# Patient Record
Sex: Female | Born: 1955 | Race: Black or African American | Hispanic: No | Smoking: Current every day smoker
Health system: Southern US, Community
[De-identification: ages and names within clinical notes are randomized; demographics above are authoritative.]

## PROBLEM LIST (undated history)

## (undated) DIAGNOSIS — F329 Major depressive disorder, single episode, unspecified: Secondary | ICD-10-CM

## (undated) DIAGNOSIS — F32A Depression, unspecified: Secondary | ICD-10-CM

## (undated) DIAGNOSIS — J45909 Unspecified asthma, uncomplicated: Secondary | ICD-10-CM

## (undated) DIAGNOSIS — E7800 Pure hypercholesterolemia, unspecified: Secondary | ICD-10-CM

## (undated) DIAGNOSIS — I10 Essential (primary) hypertension: Secondary | ICD-10-CM

## (undated) DIAGNOSIS — Z720 Tobacco use: Secondary | ICD-10-CM

## (undated) DIAGNOSIS — E119 Type 2 diabetes mellitus without complications: Secondary | ICD-10-CM

## (undated) DIAGNOSIS — F419 Anxiety disorder, unspecified: Secondary | ICD-10-CM

## (undated) HISTORY — PX: PARTIAL HYSTERECTOMY: SHX80

---

## 2006-04-09 ENCOUNTER — Emergency Department (HOSPITAL_COMMUNITY): Admission: EM | Admit: 2006-04-09 | Discharge: 2006-04-09 | Payer: Self-pay | Admitting: Emergency Medicine

## 2006-05-01 ENCOUNTER — Encounter: Admission: RE | Admit: 2006-05-01 | Discharge: 2006-07-30 | Payer: Self-pay | Admitting: Occupational Medicine

## 2006-11-29 ENCOUNTER — Ambulatory Visit (HOSPITAL_COMMUNITY): Admission: RE | Admit: 2006-11-29 | Discharge: 2006-11-29 | Payer: Self-pay | Admitting: Orthopedic Surgery

## 2010-05-21 ENCOUNTER — Emergency Department (HOSPITAL_BASED_OUTPATIENT_CLINIC_OR_DEPARTMENT_OTHER)
Admission: EM | Admit: 2010-05-21 | Discharge: 2010-05-21 | Disposition: A | Discharge status: Home / Self Care | Payer: BC Managed Care – PPO | Attending: Emergency Medicine | Admitting: Emergency Medicine

## 2010-05-21 ENCOUNTER — Emergency Department (INDEPENDENT_AMBULATORY_CARE_PROVIDER_SITE_OTHER): Payer: BC Managed Care – PPO

## 2010-05-21 DIAGNOSIS — J45909 Unspecified asthma, uncomplicated: Secondary | ICD-10-CM | POA: Insufficient documentation

## 2010-05-21 DIAGNOSIS — R112 Nausea with vomiting, unspecified: Secondary | ICD-10-CM | POA: Insufficient documentation

## 2010-05-21 DIAGNOSIS — E870 Hyperosmolality and hypernatremia: Secondary | ICD-10-CM | POA: Insufficient documentation

## 2010-05-21 LAB — DIFFERENTIAL
Basophils Absolute: 0 10*3/uL (ref 0.0–0.1)
Basophils Relative: 0 % (ref 0–1)
Eosinophils Absolute: 0.1 10*3/uL (ref 0.0–0.7)
Eosinophils Relative: 1 % (ref 0–5)
Lymphocytes Relative: 30 % (ref 12–46)
Lymphs Abs: 2.2 10*3/uL (ref 0.7–4.0)
Monocytes Absolute: 0.3 10*3/uL (ref 0.1–1.0)
Monocytes Relative: 5 % (ref 3–12)
Neutro Abs: 4.7 10*3/uL (ref 1.7–7.7)

## 2010-05-21 LAB — COMPREHENSIVE METABOLIC PANEL
Alkaline Phosphatase: 70 U/L (ref 39–117)
BUN: 11 mg/dL (ref 6–23)
GFR calc non Af Amer: >60 mL/min (ref >60)

## 2010-05-21 LAB — CBC
HCT: 42.4 % (ref 36.0–46.0)
MCV: 93.6 fL (ref 78.0–100.0)
WBC: 7.3 10*3/uL (ref 4.0–10.5)

## 2010-08-01 NOTE — Op Note (Signed)
NAMEAMYRE, [name]              ACCOUNT NO.:  [id number]   MEDICAL RECORD NO.:  [id number]          PATIENT TYPE:  AMB   LOCATION:  SDS                          FACILITY:  [facility]   PHYSICIAN:  Feliberto Gottron. Turner Daniels, M.D.   DATE OF BIRTH:  [birth year]   DATE OF PROCEDURE:  11/29/2006  DATE OF DISCHARGE:                               OPERATIVE REPORT   PREOPERATIVE DIAGNOSIS:  Right knee medial meniscal tear, possible  chondromalacia as well as possible anteromedial bursitis.   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   PROCEDURE:  Right knee arthroscopic partial medial meniscectomy and  debridement of chondromalacia medial femoral condyle medial tibial  plateau and attempted aspiration of anteromedial bursitis.   SURGEON:  Feliberto Gottron. Turner Daniels, M.D.   ASSISTANT:  Skip Mayer, P.A.-C.   ANESTHESIA:  General endotracheal.   ESTIMATED BLOOD LOSS:  Minimal.   FLUID REPLACEMENT:  800 mL crystalloid.   DRAINS PLACED:  None.   TOURNIQUET TIME:  None.   INDICATIONS FOR PROCEDURE:  A 55 year old woman who was injured at work  when a truck tire ran over her right knee and thigh.  When I first met  her, she had some anteromedial bursitis documented by MRI scan that  apparently has defervesced over the last few weeks.  She did have a  medial meniscal tear with mechanical symptoms and desires elective  arthroscopic evaluation and treatment of her right knee.  Risks and  benefits of surgery were discussed, questions answered.   DESCRIPTION OF PROCEDURE:  The patient was identified by arm band and  taken to the operating room at [facility] where the surgery was  done because of body habitus as well as a potential for sleep apnea.  The appropriate anesthetic monitors were attached and general  endotracheal anesthesia induced with the patient in the supine position.  The lateral post applied to the table and then the right lower extremity  was prepped and draped in the usual sterile fashion from the ankle to  the mid thigh.  Using a #11 blade, standard inferomedial and  inferolateral peripatellar portals were then made allowing introduction  of the arthroscope to the inferolateral portal.  The outflow was through  the inferomedial portal.  Diagnostic arthroscopy revealed grade 2 to 3  chondromalacia of the patella and trochlea which is lightly debrided  with a 3.5 gator sucker shaver.  It was more grade 2 chondromalacia not  grade 3.  Moving in the medial compartment, grade 3 chondromalacia with  flap tears of the medial femoral condyle and medial tibial plateau were  identified and debrided as was complex degenerative tearing of the  posterior and medial horns of the medial meniscus.  The ACL and PCL were  intact.  On the lateral side, there was some incidental tearing of the  lateral meniscus that was mainly degenerative and the articular  cartilage in the posterior horn of the  lateral meniscus were in  excellent condition.  The gutters were cleared medially and laterally.  We then attempted to locate or isolate an anteromedial bursa seen on the  MRI scan but not palpated at surgery today.  I made 5 or 6 passes with  an 18-gauge needle in the area where the bursa was supposed to be and  got out a maximum of about 1 mL of normal appearing fluid that has a  slight yellowish tint to it and was translucent.   At this point, the arthroscopic instruments removed.  A dressing of  Xeroform, 4 x 4 dressing, sponges, Webril and an Ace wrap applied.  The  patient was then awakened and taken to the recovery room without  difficulty.      Feliberto Gottron. Turner Daniels, M.D.  Electronically Signed     FJR/MEDQ  D:  11/29/2006  T:  11/29/2006  Job:  [id number]

## 2010-12-29 LAB — DIFFERENTIAL
Basophils Relative: 0
Eosinophils Absolute: 0.2
Lymphocytes Relative: 26
Monocytes Absolute: 0.5
Monocytes Relative: 7
Neutro Abs: 4.9

## 2010-12-29 LAB — PROTIME-INR
INR: 1
Prothrombin Time: 12.9

## 2010-12-29 LAB — CBC
HCT: 44
MCV: 94.1
RBC: 4.67
RDW: 14.6 — ABNORMAL HIGH
WBC: 7.6

## 2010-12-29 LAB — APTT: aPTT: 30

## 2017-09-06 ENCOUNTER — Encounter (HOSPITAL_BASED_OUTPATIENT_CLINIC_OR_DEPARTMENT_OTHER): Payer: Self-pay | Admitting: *Deleted

## 2017-09-06 ENCOUNTER — Emergency Department (HOSPITAL_BASED_OUTPATIENT_CLINIC_OR_DEPARTMENT_OTHER): Payer: Medicare Other

## 2017-09-06 ENCOUNTER — Inpatient Hospital Stay (HOSPITAL_COMMUNITY): Payer: Medicare Other

## 2017-09-06 ENCOUNTER — Inpatient Hospital Stay (HOSPITAL_BASED_OUTPATIENT_CLINIC_OR_DEPARTMENT_OTHER)
Admission: EM | Admit: 2017-09-06 | Discharge: 2017-09-08 | DRG: 189 | Disposition: A | Discharge status: Home / Self Care | Payer: Medicare Other | Attending: Internal Medicine | Admitting: Internal Medicine

## 2017-09-06 ENCOUNTER — Other Ambulatory Visit: Payer: Self-pay

## 2017-09-06 DIAGNOSIS — I159 Secondary hypertension, unspecified: Secondary | ICD-10-CM | POA: Diagnosis not present

## 2017-09-06 DIAGNOSIS — E119 Type 2 diabetes mellitus without complications: Secondary | ICD-10-CM | POA: Diagnosis present

## 2017-09-06 DIAGNOSIS — I2510 Atherosclerotic heart disease of native coronary artery without angina pectoris: Secondary | ICD-10-CM | POA: Diagnosis present

## 2017-09-06 DIAGNOSIS — Z6841 Body Mass Index (BMI) 40.0 and over, adult: Secondary | ICD-10-CM

## 2017-09-06 DIAGNOSIS — I700 Atherosclerosis of aorta: Secondary | ICD-10-CM | POA: Diagnosis present

## 2017-09-06 DIAGNOSIS — Z7984 Long term (current) use of oral hypoglycemic drugs: Secondary | ICD-10-CM | POA: Diagnosis not present

## 2017-09-06 DIAGNOSIS — R112 Nausea with vomiting, unspecified: Secondary | ICD-10-CM | POA: Diagnosis present

## 2017-09-06 DIAGNOSIS — T380X5A Adverse effect of glucocorticoids and synthetic analogues, initial encounter: Secondary | ICD-10-CM | POA: Diagnosis not present

## 2017-09-06 DIAGNOSIS — J441 Chronic obstructive pulmonary disease with (acute) exacerbation: Secondary | ICD-10-CM

## 2017-09-06 DIAGNOSIS — E8770 Fluid overload, unspecified: Secondary | ICD-10-CM | POA: Diagnosis not present

## 2017-09-06 DIAGNOSIS — F419 Anxiety disorder, unspecified: Secondary | ICD-10-CM | POA: Diagnosis present

## 2017-09-06 DIAGNOSIS — Z79899 Other long term (current) drug therapy: Secondary | ICD-10-CM

## 2017-09-06 DIAGNOSIS — Z5329 Procedure and treatment not carried out because of patient's decision for other reasons: Secondary | ICD-10-CM | POA: Diagnosis not present

## 2017-09-06 DIAGNOSIS — F329 Major depressive disorder, single episode, unspecified: Secondary | ICD-10-CM | POA: Diagnosis not present

## 2017-09-06 DIAGNOSIS — R0989 Other specified symptoms and signs involving the circulatory and respiratory systems: Secondary | ICD-10-CM | POA: Diagnosis not present

## 2017-09-06 DIAGNOSIS — J9601 Acute respiratory failure with hypoxia: Principal | ICD-10-CM | POA: Diagnosis present

## 2017-09-06 DIAGNOSIS — F17210 Nicotine dependence, cigarettes, uncomplicated: Secondary | ICD-10-CM | POA: Diagnosis present

## 2017-09-06 DIAGNOSIS — R0902 Hypoxemia: Secondary | ICD-10-CM

## 2017-09-06 DIAGNOSIS — F32A Depression, unspecified: Secondary | ICD-10-CM

## 2017-09-06 DIAGNOSIS — E785 Hyperlipidemia, unspecified: Secondary | ICD-10-CM | POA: Diagnosis not present

## 2017-09-06 DIAGNOSIS — D72829 Elevated white blood cell count, unspecified: Secondary | ICD-10-CM | POA: Diagnosis not present

## 2017-09-06 DIAGNOSIS — I10 Essential (primary) hypertension: Secondary | ICD-10-CM | POA: Diagnosis present

## 2017-09-06 DIAGNOSIS — J810 Acute pulmonary edema: Secondary | ICD-10-CM | POA: Diagnosis not present

## 2017-09-06 DIAGNOSIS — R0602 Shortness of breath: Secondary | ICD-10-CM

## 2017-09-06 DIAGNOSIS — R220 Localized swelling, mass and lump, head: Secondary | ICD-10-CM | POA: Diagnosis present

## 2017-09-06 DIAGNOSIS — D751 Secondary polycythemia: Secondary | ICD-10-CM | POA: Diagnosis present

## 2017-09-06 DIAGNOSIS — Z23 Encounter for immunization: Secondary | ICD-10-CM | POA: Diagnosis present

## 2017-09-06 DIAGNOSIS — Z720 Tobacco use: Secondary | ICD-10-CM

## 2017-09-06 HISTORY — DX: Anxiety disorder, unspecified: F41.9

## 2017-09-06 HISTORY — DX: Essential (primary) hypertension: I10

## 2017-09-06 HISTORY — DX: Pure hypercholesterolemia, unspecified: E78.00

## 2017-09-06 HISTORY — DX: Unspecified asthma, uncomplicated: J45.909

## 2017-09-06 HISTORY — DX: Major depressive disorder, single episode, unspecified: F32.9

## 2017-09-06 HISTORY — DX: Type 2 diabetes mellitus without complications: E11.9

## 2017-09-06 HISTORY — DX: Tobacco use: Z72.0

## 2017-09-06 HISTORY — DX: Depression, unspecified: F32.A

## 2017-09-06 LAB — CBC WITH DIFFERENTIAL/PLATELET
BASOS PCT: 0 %
Basophils Absolute: 0 10*3/uL (ref 0.0–0.1)
EOS ABS: 0.1 10*3/uL (ref 0.0–0.7)
Eosinophils Relative: 2 %
HCT: 47 % — ABNORMAL HIGH (ref 36.0–46.0)
HEMOGLOBIN: 15.2 g/dL — AB (ref 12.0–15.0)
Lymphocytes Relative: 19 %
Lymphs Abs: 1.5 10*3/uL (ref 0.7–4.0)
MCH: 31.7 pg (ref 26.0–34.0)
MCHC: 32.3 g/dL (ref 30.0–36.0)
MCV: 98.1 fL (ref 78.0–100.0)
MONO ABS: 0.4 10*3/uL (ref 0.1–1.0)
MONOS PCT: 5 %
NEUTROS PCT: 74 %
Neutro Abs: 6.1 10*3/uL (ref 1.7–7.7)
Platelets: 186 10*3/uL (ref 150–400)
RBC: 4.79 MIL/uL (ref 3.87–5.11)
RDW: 16.9 % — AB (ref 11.5–15.5)
WBC: 8.1 10*3/uL (ref 4.0–10.5)

## 2017-09-06 LAB — COMPREHENSIVE METABOLIC PANEL
ALBUMIN: 3.5 g/dL (ref 3.5–5.0)
ALK PHOS: 60 U/L (ref 38–126)
ALT: 14 U/L (ref 14–54)
ANION GAP: 6 (ref 5–15)
AST: 19 U/L (ref 15–41)
BILIRUBIN TOTAL: 0.4 mg/dL (ref 0.3–1.2)
BUN: 11 mg/dL (ref 6–20)
CO2: 35 mmol/L — ABNORMAL HIGH (ref 22–32)
Calcium: 8.4 mg/dL — ABNORMAL LOW (ref 8.9–10.3)
Chloride: 103 mmol/L (ref 101–111)
Creatinine, Ser: 0.67 mg/dL (ref 0.44–1.00)
GFR calc Af Amer: >60 mL/min (ref >60)
GLUCOSE: 107 mg/dL — AB (ref 65–99)
Potassium: 4.2 mmol/L (ref 3.5–5.1)
Sodium: 144 mmol/L (ref 135–145)
TOTAL PROTEIN: 7.1 g/dL (ref 6.5–8.1)

## 2017-09-06 LAB — BRAIN NATRIURETIC PEPTIDE: B NATRIURETIC PEPTIDE 5: 46 pg/mL (ref 0.0–100.0)

## 2017-09-06 LAB — GLUCOSE, CAPILLARY
Glucose-Capillary: 164 mg/dL — ABNORMAL HIGH (ref 65–99)
Glucose-Capillary: 168 mg/dL — ABNORMAL HIGH (ref 65–99)

## 2017-09-06 LAB — TROPONIN I

## 2017-09-06 MED ORDER — METHYLPREDNISOLONE SODIUM SUCC 125 MG IJ SOLR
60.0000 mg | Freq: Four times a day (QID) | INTRAMUSCULAR | Status: DC
  Administered 2017-09-06 – 2017-09-08 (×7): 60 mg via INTRAVENOUS
  Filled 2017-09-06 (×8): qty 2

## 2017-09-06 MED ORDER — IPRATROPIUM-ALBUTEROL 0.5-2.5 (3) MG/3ML IN SOLN
3.0000 mL | Freq: Four times a day (QID) | RESPIRATORY_TRACT | Status: DC
  Administered 2017-09-06: 3 mL via RESPIRATORY_TRACT
  Filled 2017-09-06: qty 3

## 2017-09-06 MED ORDER — METHYLPREDNISOLONE SODIUM SUCC 125 MG IJ SOLR
125.0000 mg | Freq: Once | INTRAMUSCULAR | Status: AC
  Administered 2017-09-06: 125 mg via INTRAVENOUS
  Filled 2017-09-06: qty 2

## 2017-09-06 MED ORDER — ACETAMINOPHEN 650 MG RE SUPP: 650.0000 mg | Freq: Four times a day (QID) | RECTAL | Status: DC | PRN

## 2017-09-06 MED ORDER — ONDANSETRON HCL 4 MG/2ML IJ SOLN: 4.0000 mg | Freq: Four times a day (QID) | INTRAMUSCULAR | Status: DC | PRN

## 2017-09-06 MED ORDER — ALBUTEROL SULFATE (2.5 MG/3ML) 0.083% IN NEBU
2.5000 mg | INHALATION_SOLUTION | Freq: Once | RESPIRATORY_TRACT | Status: AC
  Administered 2017-09-06: 2.5 mg via RESPIRATORY_TRACT
  Filled 2017-09-06: qty 3

## 2017-09-06 MED ORDER — ALBUTEROL SULFATE (2.5 MG/3ML) 0.083% IN NEBU
2.5000 mg | INHALATION_SOLUTION | RESPIRATORY_TRACT | Status: DC | PRN
  Administered 2017-09-08: 2.5 mg via RESPIRATORY_TRACT
  Filled 2017-09-06: qty 3

## 2017-09-06 MED ORDER — NEBIVOLOL HCL 5 MG PO TABS
5.0000 mg | ORAL_TABLET | Freq: Every day | ORAL | Status: DC
  Administered 2017-09-06 – 2017-09-07 (×2): 5 mg via ORAL
  Filled 2017-09-06 (×3): qty 1

## 2017-09-06 MED ORDER — HYDROCODONE-ACETAMINOPHEN 5-325 MG PO TABS: 1.0000 | ORAL_TABLET | ORAL | Status: DC | PRN

## 2017-09-06 MED ORDER — SODIUM CHLORIDE 0.9% FLUSH
3.0000 mL | Freq: Two times a day (BID) | INTRAVENOUS | Status: DC
  Administered 2017-09-06 – 2017-09-07 (×3): 3 mL via INTRAVENOUS

## 2017-09-06 MED ORDER — SODIUM CHLORIDE 0.9% FLUSH: 3.0000 mL | INTRAVENOUS | Status: DC | PRN

## 2017-09-06 MED ORDER — POLYETHYLENE GLYCOL 3350 17 G PO PACK: 17.0000 g | PACK | Freq: Every day | ORAL | Status: DC | PRN

## 2017-09-06 MED ORDER — IPRATROPIUM-ALBUTEROL 0.5-2.5 (3) MG/3ML IN SOLN
3.0000 mL | Freq: Two times a day (BID) | RESPIRATORY_TRACT | Status: DC
  Administered 2017-09-07 – 2017-09-08 (×3): 3 mL via RESPIRATORY_TRACT
  Filled 2017-09-06 (×2): qty 3

## 2017-09-06 MED ORDER — IRBESARTAN 150 MG PO TABS
300.0000 mg | ORAL_TABLET | Freq: Every day | ORAL | Status: DC
  Administered 2017-09-06 – 2017-09-08 (×3): 300 mg via ORAL
  Filled 2017-09-06 (×3): qty 2

## 2017-09-06 MED ORDER — AMLODIPINE BESYLATE 10 MG PO TABS
10.0000 mg | ORAL_TABLET | Freq: Every day | ORAL | Status: DC
  Administered 2017-09-06 – 2017-09-08 (×3): 10 mg via ORAL
  Filled 2017-09-06 (×3): qty 1

## 2017-09-06 MED ORDER — ALBUTEROL (5 MG/ML) CONTINUOUS INHALATION SOLN
15.0000 mg/h | INHALATION_SOLUTION | Freq: Once | RESPIRATORY_TRACT | Status: AC
  Administered 2017-09-06: 15 mg/h via RESPIRATORY_TRACT
  Filled 2017-09-06: qty 20

## 2017-09-06 MED ORDER — SERTRALINE HCL 50 MG PO TABS
50.0000 mg | ORAL_TABLET | Freq: Every day | ORAL | Status: DC
  Administered 2017-09-06 – 2017-09-08 (×3): 50 mg via ORAL
  Filled 2017-09-06 (×3): qty 1

## 2017-09-06 MED ORDER — ONDANSETRON HCL 4 MG PO TABS: 4.0000 mg | ORAL_TABLET | Freq: Four times a day (QID) | ORAL | Status: DC | PRN

## 2017-09-06 MED ORDER — ACETAMINOPHEN 325 MG PO TABS
650.0000 mg | ORAL_TABLET | Freq: Four times a day (QID) | ORAL | Status: DC | PRN
  Administered 2017-09-07: 650 mg via ORAL
  Filled 2017-09-06: qty 2

## 2017-09-06 MED ORDER — PNEUMOCOCCAL VAC POLYVALENT 25 MCG/0.5ML IJ INJ
0.5000 mL | INJECTION | INTRAMUSCULAR | Status: AC
  Administered 2017-09-07: 0.5 mL via INTRAMUSCULAR
  Filled 2017-09-06: qty 0.5

## 2017-09-06 MED ORDER — SODIUM CHLORIDE 0.9 % IV SOLN: 250.0000 mL | INTRAVENOUS | Status: DC | PRN

## 2017-09-06 MED ORDER — ENOXAPARIN SODIUM 60 MG/0.6ML ~~LOC~~ SOLN
60.0000 mg | SUBCUTANEOUS | Status: DC
  Administered 2017-09-06 – 2017-09-07 (×2): 60 mg via SUBCUTANEOUS
  Filled 2017-09-06 (×2): qty 0.6

## 2017-09-06 MED ORDER — SIMVASTATIN 10 MG PO TABS
20.0000 mg | ORAL_TABLET | Freq: Every day | ORAL | Status: DC
  Administered 2017-09-06 – 2017-09-08 (×3): 20 mg via ORAL
  Filled 2017-09-06 (×3): qty 2

## 2017-09-06 MED ORDER — FUROSEMIDE 10 MG/ML IJ SOLN
40.0000 mg | Freq: Once | INTRAMUSCULAR | Status: AC
  Administered 2017-09-06: 40 mg via INTRAVENOUS
  Filled 2017-09-06: qty 4

## 2017-09-06 MED ORDER — VALSARTAN-HYDROCHLOROTHIAZIDE 320-25 MG PO TABS: 1.0000 | ORAL_TABLET | Freq: Every day | ORAL | Status: DC

## 2017-09-06 MED ORDER — INSULIN ASPART 100 UNIT/ML ~~LOC~~ SOLN
0.0000 [IU] | Freq: Three times a day (TID) | SUBCUTANEOUS | Status: DC
  Administered 2017-09-06: 3 [IU] via SUBCUTANEOUS
  Administered 2017-09-07: 2 [IU] via SUBCUTANEOUS
  Administered 2017-09-07: 3 [IU] via SUBCUTANEOUS
  Administered 2017-09-07 – 2017-09-08 (×2): 2 [IU] via SUBCUTANEOUS
  Administered 2017-09-08: 3 [IU] via SUBCUTANEOUS
  Administered 2017-09-08: 2 [IU] via SUBCUTANEOUS

## 2017-09-06 MED ORDER — IPRATROPIUM-ALBUTEROL 0.5-2.5 (3) MG/3ML IN SOLN
3.0000 mL | Freq: Once | RESPIRATORY_TRACT | Status: AC
  Administered 2017-09-06: 3 mL via RESPIRATORY_TRACT
  Filled 2017-09-06: qty 3

## 2017-09-06 MED ORDER — HYDROCHLOROTHIAZIDE 25 MG PO TABS
25.0000 mg | ORAL_TABLET | Freq: Every day | ORAL | Status: DC
  Administered 2017-09-06 – 2017-09-07 (×2): 25 mg via ORAL
  Filled 2017-09-06 (×2): qty 1

## 2017-09-06 MED ORDER — DOXYCYCLINE HYCLATE 100 MG PO TABS
100.0000 mg | ORAL_TABLET | Freq: Two times a day (BID) | ORAL | Status: DC
  Administered 2017-09-06 – 2017-09-08 (×5): 100 mg via ORAL
  Filled 2017-09-06 (×5): qty 1

## 2017-09-06 NOTE — Progress Notes (Signed)
  Echocardiogram 2D Echocardiogram has been performed.  Kenisha Lynds T Calley Drenning 09/06/2017, 4:30 PM

## 2017-09-06 NOTE — Progress Notes (Signed)
   [facility]. Requiring [employer] O2 to maintain sat > 92%. CXR with pulmonary edema and on exam trace peripheral edema as well as respiratory wheezes. She has received IV lasix, solumedrol, continuous nebs and improved clinically. [facility]    Noralee StainJennifer Jamareon Shimel, DO [facility] [email or web address] Password [email or web address] 09/06/2017, 9:57 AM

## 2017-09-06 NOTE — ED Provider Notes (Signed)
Emergency Department Provider Note   I have reviewed the triage vital signs and the nursing notes.   HISTORY  Chief Complaint Shortness of Breath   HPI [name] is a 62 y.o. female with PMH of COPD, asthma, elevated BMI, DM, and HTN presents to the emergency department for evaluation of shortness of breath and sore throat.  Symptoms began yesterday and worsened through the evening.  She denies any fevers or chills.  She feels like her tongue may be slightly swollen sore throat.  She has not started any new medications.  She denies any itchy rash.  No fevers or chills.  No productive cough or hemoptysis.  No vomiting or diarrhea.  Allergies Patient has no known allergies.  No family history on file.  Social History Not currently a smoker.   Review of Systems  Constitutional: No fever/chills Eyes: No visual changes. ENT: Positive sore throat and tongue swelling.  Cardiovascular: Denies chest pain. Respiratory: Positive shortness of breath. Gastrointestinal: No abdominal pain.  No nausea, no vomiting.  No diarrhea.  No constipation. Genitourinary: Negative for dysuria. Musculoskeletal: Negative for back pain. Skin: Negative for rash. Neurological: Negative for headaches, focal weakness or numbness.  10-point ROS otherwise negative.  ____________________________________________   PHYSICAL EXAM:  VITAL SIGNS: ED Triage Vitals  Enc Vitals Group     BP 09/06/17 0821 (!) 180/102     Pulse Rate 09/06/17 0821 65     Resp 09/06/17 0821 (!) 30     Temp 09/06/17 0821 98.6 F (37 C)     Temp Source 09/06/17 0821 Oral     SpO2 09/06/17 0821 (!) 76 %     Weight 09/06/17 0828 253 lb (114.8 kg)     Height 09/06/17 0828 5\' 1"  (1.549 m)   Constitutional: Alert and oriented. Well appearing and in no acute distress. Eyes: Conjunctivae are normal. Head: Atraumatic. Nose: No congestion/rhinnorhea. Mouth/Throat: Mucous membranes are moist.  Oropharynx non-erythematous. No  appreciable tongue swelling. Posterior pharynx is easily visualized. No PTA. Managing oral secretions. Normal voice.  Neck: No stridor. Tender cervical adenopathy.  Cardiovascular: Normal rate, regular rhythm. Good peripheral circulation. Grossly normal heart sounds.   Respiratory: Increased respiratory effort.  No retractions. Lungs diminished bilaterally with faint end-expiratory wheezing at the bases. No rales or rhonchi. Overall poor air movement.  Gastrointestinal: Soft and nontender. No distention.  Musculoskeletal: No lower extremity tenderness nor edema. No gross deformities of extremities. Neurologic:  Normal speech and language. No gross focal neurologic deficits are appreciated.  Skin:  Skin is warm, dry and intact. No rash noted.  ____________________________________________   LABS (all labs ordered are listed, but only abnormal results are displayed)  Labs Reviewed  COMPREHENSIVE METABOLIC PANEL - Abnormal; Notable for the following components:      Result Value   CO2 35 (*)    Glucose, Bld 107 (*)    Calcium 8.4 (*)    All other components within normal limits  CBC WITH DIFFERENTIAL/PLATELET - Abnormal; Notable for the following components:   Hemoglobin 15.2 (*)    HCT 47.0 (*)    RDW 16.9 (*)    All other components within normal limits  TROPONIN I  BRAIN NATRIURETIC PEPTIDE   ____________________________________________  EKG   EKG Interpretation  Date/Time:  Friday September 06 2017 08:30:41 EDT Ventricular Rate:  60 PR Interval:    QRS Duration: 100 QT Interval:  416 QTC Calculation: 416 R Axis:   93 Text Interpretation:  Sinus rhythm Right  axis deviation Low voltage, extremity and precordial leads No STEMI.  Confirmed by Alona Bene [phone]) on 09/06/2017 8:39:35 AM       ____________________________________________  RADIOLOGY  Dg Chest Portable 1 View  Result Date: 09/06/2017 CLINICAL DATA:  Shortness of breath. EXAM: PORTABLE CHEST 1 VIEW COMPARISON:   05/21/2010. FINDINGS: Cardiomegaly with pulmonary vascular prominence and bilateral interstitial prominence consistent with CHF. No pleural effusion or pneumothorax. IMPRESSION: Cardiomegaly with pulmonary vascular prominence and bilateral interstitial prominence suggesting CHF. Electronically Signed   By: Maisie Fus  Register   On: 09/06/2017 08:52    ____________________________________________   PROCEDURES  Procedure(s) performed:   .Critical Care Performed by: Maia Plan, MD Authorized by: Maia Plan, MD   Critical care provider statement:    Critical care time (minutes):  35   Critical care time was exclusive of:  Separately billable procedures and treating other patients and teaching time   Critical care was necessary to treat or prevent imminent or life-threatening deterioration of the following conditions:  Respiratory failure   Critical care was time spent personally by me on the following activities:  Development of treatment plan with patient or surrogate, blood draw for specimens, evaluation of patient's response to treatment, examination of patient, obtaining history from patient or surrogate, ordering and performing treatments and interventions, ordering and review of laboratory studies, ordering and review of radiographic studies, pulse oximetry, re-evaluation of patient's condition and review of old charts   I assumed direction of critical care for this patient from another provider in my specialty: no       ____________________________________________   INITIAL IMPRESSION / ASSESSMENT AND PLAN / ED COURSE  Pertinent labs & imaging results that were available during my care of the patient were reviewed by me and considered in my medical decision making (see chart for details).  Patient presents to the emergency department for evaluation after bruising worsening shortness of breath and sensation of sore throat with tongue swelling.  I do not appreciate any tongue or  posterior pharyngeal swelling on my exam.  Patient is speaking in a clear voice.  He has a history of COPD and her lung exam correlates well with COPD exacerbation.  She has decreased air entry with end expiratory wheezing at the bases bilaterally.  Patient is not on ACE or ARB.  Plan for aggressive treatment of her shortness of breath with albuterol.  Patient will likely need transition to continuous albuterol.  She is hypoxemic on arrival but overall is doing well.  She is able to provide a history and is joking with staff at times.   09:00 AM Patient feeling better now on continuous. CXR with no infiltrate. Some pulmonary edema noted. No known history of CHF. Fluid may be contributing to SOB and hypoxemia so will start lasix but suspect that presentation is multifactorial. Steroids ordered as well. Anticipate admission. Patient currently on 4L O2.   Discussed patient's case with Hospitalist to request admission. Patient and family (if present) updated with plan. Care transferred to Hospitalist service.  I reviewed all nursing notes, vitals, pertinent old records, EKGs, labs, imaging (as available).  ____________________________________________  FINAL CLINICAL IMPRESSION(S) / ED DIAGNOSES  Final diagnoses:  COPD exacerbation (HCC)  Hypoxia  Acute pulmonary edema (HCC)     MEDICATIONS GIVEN DURING THIS VISIT:  Medications  ipratropium-albuterol (DUONEB) 0.5-2.5 (3) MG/3ML nebulizer solution 3 mL (3 mLs Nebulization Given 09/06/17 0836)  albuterol (PROVENTIL) (2.5 MG/3ML) 0.083% nebulizer solution 2.5 mg (2.5 mg Nebulization Given  09/06/17 0836)  albuterol (PROVENTIL,VENTOLIN) solution continuous neb (15 mg/hr Nebulization Given 09/06/17 0849)  methylPREDNISolone sodium succinate (SOLU-MEDROL) 125 mg/2 mL injection 125 mg (125 mg Intravenous Given 09/06/17 1033)  furosemide (LASIX) injection 40 mg (40 mg Intravenous Given 09/06/17 1033)    Note:  This document was prepared using Dragon voice  recognition software and may include unintentional dictation errors.  Alona Bene, MD Emergency Medicine    Jaxx Huish, Arlyss Repress, MD 09/06/17 [phone]

## 2017-09-06 NOTE — H&P (Signed)
History and Physical    [name] YNW:[id number] DOB: 09/07/1955 DOA: 09/06/2017  [facility]  [facility] from: Home [facility]   Chief Complaint: SOB  HPI: Marie FareRoslyn Henderson is a 62 y.o. female with medical history significant of DM, HTN, HLD, depression who presents with 1 day history of shortness of breath, non-productive cough. She also admits to swollen tongue but now that has resolved. She denies any fevers, but has had chills. Some nausea and 1 time vomiting, but no abdominal pain or diarrhea.  She denies any chest pain. She is a current everyday smoker, has cut herself down from 2 packs/day to 6 cigarettes/day.  She has been smoking for over 20 years.  She also admits to peripheral edema over the past week.  ED Course: Patient was hypoxic on room air 76 to 83%.  This improved with nasal cannula O2.  Troponin negative, BNP 46.  Chest x-ray showed "cardiomegaly with pulmonary vascular prominence and bilateral interstitial prominence suggesting CHF"  Review of Systems: As per HPI otherwise 10 point review of systems negative.   Past Medical History:  Diagnosis Date  . Anxiety   . Asthma   . Depression   . Diabetes mellitus without complication (HCC)   . Hypercholesteremia   . Hypertension   . Tobacco abuse     Past Surgical History:  Procedure Laterality Date  . CESAREAN SECTION     x2  . PARTIAL HYSTERECTOMY       reports that she has been smoking cigarettes.  She does not have any smokeless tobacco history on file. Her alcohol and drug histories are not on file.  No Known Allergies  Family History  Problem Relation Age of Onset  . Alzheimer's disease Mother   . Diabetes Brother     Prior to Admission medications   Medication Sig Start Date End Date Taking? Authorizing Provider  albuterol (VENTOLIN HFA) 108 (90 Base) MCG/ACT inhaler Inhale into the lungs. 04/20/16  Yes [provider]  metFORMIN (GLUCOPHAGE) 500 MG tablet Take by mouth.  06/29/16  Yes [provider]  nebivolol (BYSTOLIC) 5 MG tablet Take by mouth. 08/21/16  Yes [provider]  albuterol (PROVENTIL) (2.5 MG/3ML) 0.083% nebulizer solution Inhale into the lungs.    [provider]  amLODipine (NORVASC) 10 MG tablet Take by mouth.    [provider]  Cholecalciferol (VITAMIN D-1000 MAX ST) 1000 units tablet Take by mouth.    [provider]  furosemide (LASIX) 20 MG tablet  08/15/17   [provider]  oxybutynin (DITROPAN) 5 MG tablet Take by mouth.    [provider]  sertraline (ZOLOFT) 50 MG tablet Take by mouth.    [provider]  simvastatin (ZOCOR) 20 MG tablet Take by mouth.    [provider]  TRULICITY 1.5 MG/0.5ML SOPN  06/26/17   [provider]  valsartan-hydrochlorothiazide (DIOVAN-HCT) 320-25 MG tablet Take by mouth.    [provider]  Vitamin D, Ergocalciferol, (DRISDOL) 50000 units CAPS capsule Take by mouth.    [provider]    Physical Exam: Vitals:   09/06/17 0952 09/06/17 1030 09/06/17 1100 09/06/17 1130  BP:  117/70 135/77 123/71  Pulse:  62 63 71  Resp:  (!) 24 (!) 25 (!) 26  Temp:      TempSrc:      SpO2: 94% 90% 93% 92%  Weight:      Height:  Constitutional: NAD, calm, comfortable Eyes: PERRL, lids and conjunctivae normal ENMT: Mucous membranes are moist. Posterior pharynx clear of any exudate or lesions.Normal dentition.  Neck: normal, supple, no masses, no thyromegaly Respiratory: Bilateral wheezes, bibasilar crackles.  On nasal cannula O2, no conversational dyspnea. Normal respiratory effort. No accessory muscle use.  Cardiovascular: Regular rate and rhythm, no murmurs / rubs / gallops.  Trace extremity edema.  Abdomen: no tenderness, no masses palpated. No hepatosplenomegaly. Bowel sounds positive.  Musculoskeletal: no clubbing / cyanosis. No joint deformity upper and lower extremities. Good ROM, no  contractures. Normal muscle tone.  Skin: no rashes, lesions, ulcers. No induration Neurologic: CN 2-12 grossly intact. Strength 5/5 in all 4.  Psychiatric: Normal judgment and insight. Alert and oriented x 3. Normal mood.   Labs on Admission: I have personally reviewed following labs and imaging studies  CBC: Recent Labs  Lab 09/06/17 0838  WBC 8.1  NEUTROABS 6.1  HGB 15.2*  HCT 47.0*  MCV 98.1  PLT 186   Basic Metabolic Panel: Recent Labs  Lab 09/06/17 0838  NA 144  K 4.2  CL 103  CO2 35*  GLUCOSE 107*  BUN 11  CREATININE 0.67  CALCIUM 8.4*   GFR: Estimated Creatinine Clearance: 87 mL/min (by C-G formula based on SCr of 0.67 mg/dL). Liver Function Tests: Recent Labs  Lab 09/06/17 0838  AST 19  ALT 14  ALKPHOS 60  BILITOT 0.4  PROT 7.1  ALBUMIN 3.5   No results for input(s): LIPASE, AMYLASE in the last 168 hours. No results for input(s): AMMONIA in the last 168 hours. Coagulation Profile: No results for input(s): INR, PROTIME in the last 168 hours. Cardiac Enzymes: Recent Labs  Lab 09/06/17 0838  TROPONINI <0.03   BNP (last 3 results) No results for input(s): PROBNP in the last 8760 hours. HbA1C: No results for input(s): HGBA1C in the last 72 hours. CBG: No results for input(s): GLUCAP in the last 168 hours. Lipid Profile: No results for input(s): CHOL, HDL, LDLCALC, TRIG, CHOLHDL, LDLDIRECT in the last 72 hours. Thyroid Function Tests: No results for input(s): TSH, T4TOTAL, FREET4, T3FREE, THYROIDAB in the last 72 hours. Anemia Panel: No results for input(s): VITAMINB12, FOLATE, FERRITIN, TIBC, IRON, RETICCTPCT in the last 72 hours. Urine analysis: No results found for: COLORURINE, APPEARANCEUR, LABSPEC, PHURINE, GLUCOSEU, HGBUR, BILIRUBINUR, KETONESUR, PROTEINUR, UROBILINOGEN, NITRITE, LEUKOCYTESUR Sepsis Labs: !!!!!!!!!!!!!!!!!!!!!!!!!!!!!!!!!!!!!!!!!!!! @LABRCNTIP (procalcitonin:4,lacticidven:4) )No results found for this or any previous visit  (from the past 240 hour(s)).   Radiological Exams on Admission: Dg Chest Portable 1 View  Result Date: 09/06/2017 CLINICAL DATA:  Shortness of breath. EXAM: PORTABLE CHEST 1 VIEW COMPARISON:  05/21/2010. FINDINGS: Cardiomegaly with pulmonary vascular prominence and bilateral interstitial prominence consistent with CHF. No pleural effusion or pneumothorax. IMPRESSION: Cardiomegaly with pulmonary vascular prominence and bilateral interstitial prominence suggesting CHF. Electronically Signed   By: Maisie Fus  Register   On: 09/06/2017 08:52    EKG: Independently reviewed. Normal sinus rhythm   Assessment/Plan Principal Problem:   Acute hypoxemic respiratory failure (HCC) Active Problems:   COPD with acute exacerbation (HCC)   DM (diabetes mellitus) (HCC)   HTN (hypertension)   Depression   HLD (hyperlipidemia)   Tobacco abuse   Acute hypoxemic respiratory failure -Continue nasal cannula O2, wean as able  COPD exacerbation -Doxycycline, IV Solu-Medrol, breathing tx  ?CHF -Chest x-ray revealed bilateral pulmonary vascular prominence with bilateral interstitial prominence.  However BNP only 46.  She does have trace bilateral peripheral edema which is new over the past  week -Patient was given IV Lasix in the emergency department -Check echocardiogram  DM  -Hold metformin -SSI  HTN -Continue bystolic, norvasc, valsartan-HCTZ  Depression -Continue zoloft  HLD -Continue zocor  Tobacco abuse -Cessation counseling   DVT prophylaxis: Lovenox Code Status: Full   Family Communication: Family at bedside Disposition Plan: Need clinical improvement, suspect return to home setting on discharge Consults called: None  Admission status: Inpatient   * I certify that at the point of admission it is my clinical judgment that the patient will require inpatient hospital care spanning beyond 2 midnights from the point of admission due to high intensity of service, high risk for further  deterioration and high frequency of surveillance required.*   Noralee Stain, DO [facility] [email or web address] Password [email or web address] 09/06/2017, 1:06 PM

## 2017-09-06 NOTE — Progress Notes (Signed)
Refused cpap.

## 2017-09-06 NOTE — ED Notes (Signed)
Patient transported to X-ray 

## 2017-09-06 NOTE — ED Triage Notes (Signed)
Pt reports sob and wheezing x this am, brought directly to room 7, rt at bedside, dr. Jacqulyn BathLong alerted, ekg in progress. Initial room air sat 83%, placed on o2 at 4lpm by rt, sats increase to 98%. Pt with labored resp, unable to talk in full sentences, audible wheezes noted. Pt denies any cp, states "My tongue feels swollen." no rash or itching noted, dr. Jacqulyn BathLong at bedside examining pt. During triage.

## 2017-09-06 NOTE — ED Notes (Signed)
Report called [place] the floor [place] RN

## 2017-09-06 NOTE — Progress Notes (Signed)
Pt where a CPAP at [facility]. RT asked the nurse to get the doctor to order.

## 2017-09-06 NOTE — Progress Notes (Signed)
Patient refusing CPAP. States that she does not wear at home every night. RT will continue to monitor.

## 2017-09-07 ENCOUNTER — Encounter (HOSPITAL_COMMUNITY): Payer: Self-pay

## 2017-09-07 DIAGNOSIS — R0989 Other specified symptoms and signs involving the circulatory and respiratory systems: Secondary | ICD-10-CM

## 2017-09-07 DIAGNOSIS — I159 Secondary hypertension, unspecified: Secondary | ICD-10-CM

## 2017-09-07 DIAGNOSIS — F329 Major depressive disorder, single episode, unspecified: Secondary | ICD-10-CM

## 2017-09-07 DIAGNOSIS — J9601 Acute respiratory failure with hypoxia: Principal | ICD-10-CM

## 2017-09-07 DIAGNOSIS — E119 Type 2 diabetes mellitus without complications: Secondary | ICD-10-CM

## 2017-09-07 DIAGNOSIS — E785 Hyperlipidemia, unspecified: Secondary | ICD-10-CM

## 2017-09-07 DIAGNOSIS — J441 Chronic obstructive pulmonary disease with (acute) exacerbation: Secondary | ICD-10-CM

## 2017-09-07 DIAGNOSIS — Z720 Tobacco use: Secondary | ICD-10-CM

## 2017-09-07 LAB — BASIC METABOLIC PANEL
Anion gap: 8 (ref 5–15)
BUN: 15 mg/dL (ref 6–20)
CHLORIDE: 102 mmol/L (ref 101–111)
CO2: 35 mmol/L — ABNORMAL HIGH (ref 22–32)
CREATININE: 0.69 mg/dL (ref 0.44–1.00)
Calcium: 9.2 mg/dL (ref 8.9–10.3)
GFR calc non Af Amer: >60 mL/min (ref >60)
Glucose, Bld: 144 mg/dL — ABNORMAL HIGH (ref 65–99)
POTASSIUM: 4.7 mmol/L (ref 3.5–5.1)
Sodium: 145 mmol/L (ref 135–145)

## 2017-09-07 LAB — CBC
HEMATOCRIT: 52.6 % — AB (ref 36.0–46.0)
HEMOGLOBIN: 16 g/dL — AB (ref 12.0–15.0)
MCH: 31.1 pg (ref 26.0–34.0)
MCHC: 30.4 g/dL (ref 30.0–36.0)
MCV: 102.1 fL — ABNORMAL HIGH (ref 78.0–100.0)
Platelets: 219 10*3/uL (ref 150–400)
RBC: 5.15 MIL/uL — ABNORMAL HIGH (ref 3.87–5.11)
RDW: 16.3 % — ABNORMAL HIGH (ref 11.5–15.5)
WBC: 8.9 10*3/uL (ref 4.0–10.5)

## 2017-09-07 LAB — GLUCOSE, CAPILLARY
GLUCOSE-CAPILLARY: 174 mg/dL — AB (ref 65–99)
Glucose-Capillary: 128 mg/dL — ABNORMAL HIGH (ref 65–99)
Glucose-Capillary: 121 mg/dL — ABNORMAL HIGH (ref 65–99)
Glucose-Capillary: 186 mg/dL — ABNORMAL HIGH (ref 65–99)

## 2017-09-07 LAB — HIV ANTIBODY (ROUTINE TESTING W REFLEX): HIV SCREEN 4TH GENERATION: NONREACTIVE

## 2017-09-07 MED ORDER — FUROSEMIDE 10 MG/ML IJ SOLN
40.0000 mg | Freq: Once | INTRAMUSCULAR | Status: AC
  Administered 2017-09-07: 40 mg via INTRAVENOUS
  Filled 2017-09-07: qty 4

## 2017-09-07 MED ORDER — GUAIFENESIN ER 600 MG PO TB12
1200.0000 mg | ORAL_TABLET | Freq: Two times a day (BID) | ORAL | Status: DC
  Administered 2017-09-07 – 2017-09-08 (×3): 1200 mg via ORAL
  Filled 2017-09-07 (×3): qty 2

## 2017-09-07 NOTE — Progress Notes (Signed)
PROGRESS NOTE    [name]  ZOX:[id number] DOB: 10/31/1955 DOA: 09/06/2017 PCP: [facility]   Brief Narrative:  [name] is a 62 y.o. female with medical history significant of DM, HTN, HLD, depression and other comorbids who presents with 1 day history of shortness of breath, non-productive cough. She also admits to swollen tongue but now that has resolved. She denies any fevers, but has had chills. Some nausea and 1 time vomiting, but no abdominal pain or diarrhea.  She denies any chest pain. She is a current everyday smoker, has cut herself down from 2 packs/day to 6 cigarettes/day.  She has been smoking for over 20 years.  She also admits to peripheral edema over the past week. In the ED patient was hypoxic on room air 76 to 83%.  This improved with nasal cannula O2. Troponin negative, BNP 46.  Chest x-ray showed "cardiomegaly with pulmonary vascular prominence and bilateral interstitial prominence suggesting CHF."  She was admitted for acute hypoxic respiratory failure likely secondary to COPD exacerbation suspected CHF exacerbation is currently being worked up and diuresed.  She is on antibiotics, duo nebs, supplemental oxygen and is being diuresed.  Assessment & Plan:   Principal Problem:   Acute hypoxemic respiratory failure (HCC) Active Problems:   COPD with acute exacerbation (HCC)   DM (diabetes mellitus) (HCC)   HTN (hypertension)   Depression   HLD (hyperlipidemia)   Tobacco abuse  Acute Respiratory Failure with Hypoxia in the setting of Acute COPD Exacerbation as well as concomitant CHF Decompensation  -Patient Diffusely Wheezing on Examination -CXR showed Cardiomegaly with pulmonary vascular prominence and bilateral interstitial prominence consistent with CHF. No pleural effusion or pneumothorax. -Desaturates on Room Air -Continue Supplemental oxygen via nasal cannula and wean oxygen as tolerated -Continuous pulse oximetry monitoring and maintain O2  saturation greater than 92% -Will need Ambulatory walk screen prior to discharge -Repeat chest x-ray in a.m. -Refusing CPAP as well   COPD exacerbation/ Hx of Asthma  -Still actively smokes -Continue with Solu-Medrol 60 mg IV every 6 scheduled -Continue with doxycycline 100 mg p.o. every 12 -Continue with home nebs 2.5 mg nebs every 2 PRN for wheezing and shortness of breath and tinea scheduled DuoNebs 3 mL nebulized twice daily -We will add flutter valve, incentive spirometer, and 1200 mg p.o. twice daily Guaifenesin  Suspected CHF (Unknown Type currently) -Chest x-ray revealed bilateral pulmonary vascular prominence with bilateral interstitial prominence.  -BNP only 46.0 but she is Morbidly obese.  She does have trace bilateral peripheral edema which is new over the past week -Patient was given IV Lasix in the emergency department and will given another IV 40 mg of Lasix today -Checking Echocardiogram and it is done and pending read -Strict I's/O's, Daily Weights, and Fluid Restrict to 1500 mL -Continue to Monitor Volume Status Carefully -C/w Irbesartan 300 mg po Daily and Nebivolol 5 mg po Daily for now  -Patient refusing to wear Telemetry and continues to remove  Diabetes Mellitus Type 2 -Currently Holding Metformin -C/w Moderate Novolog SSI AC -Check HbA1c -CBG's Ranging from 128-168  HTN -C/w Amlodipine 10 mg Daily, Irbesartan 300 mg po Daily, Nebivolol 5 mg po Daily , and will hold HCTZ 25 mg po Daily given IV Lasix Administration today    Depression and Anxiety  -Continue Sertraline 20 mg po Daily   HLD -Continue Simvastatin 20 mg po Daily   Tobacco Abuse -Smoking Cessation counseling given  Polycythemia/Erythrocytosis -Hb/Hct was 16.0/52.6 in the setting of Smoking -  Continue to Monitor and Repeat CBC in AM   DVT prophylaxis: Enoxaparin 60 mg q24h Code Status: FULL CODE Family Communication: No family present at bedside  Disposition Plan: Remain Inpatient  for current workup and treatment   Consultants:   None   Procedures:  ECHOCARDIOGRAM - Done and pending read  Antimicrobials:  Anti-infectives (From admission, onward)   Start     Dose/Rate Route Frequency Ordered Stop   09/06/17 1400  doxycycline (VIBRA-TABS) tablet 100 mg     100 mg Oral Every 12 hours 09/06/17 1313       Subjective: Examined at bedside and woke up from sleep and states that she is no longer having shortness of breath however per nurse this morning when they removed her oxygen she desaturated.  No chest pain, lightheadedness or dizziness.  His leg swelling has been going on for a week and still slightly there.  No other complaints or concerns and does not want to wear her telemetry.  Objective: Vitals:   09/06/17 1321 09/06/17 2005 09/07/17 0458 09/07/17 0811  BP: 131/75 (!) 113/56 127/67   Pulse: 60 64 (!) 57   Resp: (!) 26 (!) 28 18   Temp: 98.9 F (37.2 C) 98.3 F (36.8 C) 98.6 F (37 C)   TempSrc: Oral Oral Oral   SpO2: 99% 93% 91% 93%  Weight:      Height:        Intake/Output Summary (Last 24 hours) at 09/07/2017 0981 Last data filed at 09/07/2017 0825 Gross per 24 hour  Intake 360 ml  Output -  Net 360 ml   Filed Weights   09/06/17 0828  Weight: 114.8 kg (253 lb)   Examination: Physical Exam:  Constitutional: WN/WD morbidly obese AAF in NAD and appears calm and comfortable Eyes: Lids and conjunctivae normal, sclerae anicteric  ENMT: External Ears, Nose appear normal. Grossly normal hearing. Mucous membranes are moist. Neck: Appears normal, supple, no cervical masses, normal ROM, no appreciable thyromegaly, no JVD Respiratory: Diminished to auscultation bilaterally with significant wheezing and mild crackles. Normal respiratory effort and patient is not tachypenic. No accessory muscle use but wearing supplemental O2 via .   Cardiovascular: RRR, no murmurs / rubs / gallops. S1 and S2 auscultated. 1+ LE extremity edema.  Abdomen: Soft,  non-tender, Distended 2/2 body habitus. No masses palpated. No appreciable hepatosplenomegaly. Bowel sounds positive x4.  GU: Deferred. Musculoskeletal: No clubbing / cyanosis of digits/nails. No joint deformity upper and lower extremities.  Skin: No rashes, lesions, ulcers on a limited skin evaluation. No induration; Warm and dry.  Neurologic: CN 2-12 grossly intact with no focal deficits. Sensation intact in all 4 Extremities, DTR normal. Strength 5/5 in all 4. Romberg sign cerebellar reflexes not assessed.  Psychiatric: Normal judgment and insight. Alert and oriented x 3. Normal mood and appropriate affect.   Data Reviewed: I have personally reviewed following labs and imaging studies  CBC: Recent Labs  Lab 09/06/17 0838 09/07/17 0604  WBC 8.1 8.9  NEUTROABS 6.1  --   HGB 15.2* 16.0*  HCT 47.0* 52.6*  MCV 98.1 102.1*  PLT 186 219   Basic Metabolic Panel: Recent Labs  Lab 09/06/17 0838 09/07/17 0604  NA 144 145  K 4.2 4.7  CL 103 102  CO2 35* 35*  GLUCOSE 107* 144*  BUN 11 15  CREATININE 0.67 0.69  CALCIUM 8.4* 9.2   GFR: Estimated Creatinine Clearance: 87 mL/min (by C-G formula based on SCr of 0.69 mg/dL). Liver Function Tests:  Recent Labs  Lab 09/06/17 0838  AST 19  ALT 14  ALKPHOS 60  BILITOT 0.4  PROT 7.1  ALBUMIN 3.5   No results for input(s): LIPASE, AMYLASE in the last 168 hours. No results for input(s): AMMONIA in the last 168 hours. Coagulation Profile: No results for input(s): INR, PROTIME in the last 168 hours. Cardiac Enzymes: Recent Labs  Lab 09/06/17 0838  TROPONINI <0.03   BNP (last 3 results) No results for input(s): PROBNP in the last 8760 hours. HbA1C: No results for input(s): HGBA1C in the last 72 hours. CBG: Recent Labs  Lab 09/06/17 1652 09/06/17 2217 09/07/17 0758  GLUCAP 164* 168* 128*   Lipid Profile: No results for input(s): CHOL, HDL, LDLCALC, TRIG, CHOLHDL, LDLDIRECT in the last 72 hours. Thyroid Function Tests: No  results for input(s): TSH, T4TOTAL, FREET4, T3FREE, THYROIDAB in the last 72 hours. Anemia Panel: No results for input(s): VITAMINB12, FOLATE, FERRITIN, TIBC, IRON, RETICCTPCT in the last 72 hours. Sepsis Labs: No results for input(s): PROCALCITON, LATICACIDVEN in the last 168 hours.  No results found for this or any previous visit (from the past 240 hour(s)).   Radiology Studies: Dg Chest Portable 1 View  Result Date: 09/06/2017 CLINICAL DATA:  Shortness of breath. EXAM: PORTABLE CHEST 1 VIEW COMPARISON:  05/21/2010. FINDINGS: Cardiomegaly with pulmonary vascular prominence and bilateral interstitial prominence consistent with CHF. No pleural effusion or pneumothorax. IMPRESSION: Cardiomegaly with pulmonary vascular prominence and bilateral interstitial prominence suggesting CHF. Electronically Signed   By: Maisie Fushomas  Register   On: 09/06/2017 08:52   Scheduled Meds: . amLODipine  10 mg Oral Daily  . doxycycline  100 mg Oral Q12H  . enoxaparin (LOVENOX) injection  60 mg Subcutaneous Q24H  . irbesartan  300 mg Oral Daily   And  . hydrochlorothiazide  25 mg Oral Daily  . insulin aspart  0-15 Units Subcutaneous TID WC  . ipratropium-albuterol  3 mL Nebulization BID  . methylPREDNISolone (SOLU-MEDROL) injection  60 mg Intravenous Q6H  . nebivolol  5 mg Oral Daily  . pneumococcal 23 valent vaccine  0.5 mL Intramuscular Tomorrow-1000  . sertraline  50 mg Oral Daily  . simvastatin  20 mg Oral q1800  . sodium chloride flush  3 mL Intravenous Q12H   Continuous Infusions: . sodium chloride      LOS: 1 day   Merlene Laughtermair Latif Sheikh, DO [facility] Pager [phone]  If 7PM-7AM, please contact night-coverage [email or web address] Password [facility] [email or web address] 09/07/2017, 8:32 AM

## 2017-09-07 NOTE — Progress Notes (Signed)
Incentive spirometry given and instructed on use.  Patient returned demonstration.

## 2017-09-07 NOTE — Progress Notes (Signed)
Refused Tele monitior

## 2017-09-07 NOTE — Progress Notes (Signed)
CPAP not in room and patient does not wear.  Will be available if needed.

## 2017-09-08 ENCOUNTER — Inpatient Hospital Stay (HOSPITAL_COMMUNITY): Payer: Medicare Other

## 2017-09-08 DIAGNOSIS — E8770 Fluid overload, unspecified: Secondary | ICD-10-CM

## 2017-09-08 LAB — GLUCOSE, CAPILLARY
GLUCOSE-CAPILLARY: 146 mg/dL — AB (ref 65–99)
Glucose-Capillary: 166 mg/dL — ABNORMAL HIGH (ref 65–99)
Glucose-Capillary: 122 mg/dL — ABNORMAL HIGH (ref 65–99)

## 2017-09-08 LAB — ECHOCARDIOGRAM COMPLETE
HEIGHTINCHES: 61 in
WEIGHTICAEL: 4048 [oz_av]

## 2017-09-08 LAB — CBC WITH DIFFERENTIAL/PLATELET
Basophils Absolute: 0 10*3/uL (ref 0.0–0.1)
Basophils Relative: 0 %
Eosinophils Absolute: 0 10*3/uL (ref 0.0–0.7)
Eosinophils Relative: 0 %
HEMATOCRIT: 49.4 % — AB (ref 36.0–46.0)
HEMOGLOBIN: 15.3 g/dL — AB (ref 12.0–15.0)
LYMPHS PCT: 7 %
Lymphs Abs: 0.9 10*3/uL (ref 0.7–4.0)
MCH: 31.1 pg (ref 26.0–34.0)
MCHC: 31 g/dL (ref 30.0–36.0)
MCV: 100.4 fL — AB (ref 78.0–100.0)
MONO ABS: 0.2 10*3/uL (ref 0.1–1.0)
Monocytes Relative: 2 %
NEUTROS ABS: 12.5 10*3/uL — AB (ref 1.7–7.7)
Neutrophils Relative %: 91 %
Platelets: 241 10*3/uL (ref 150–400)
RBC: 4.92 MIL/uL (ref 3.87–5.11)
RDW: 16.5 % — ABNORMAL HIGH (ref 11.5–15.5)
WBC: 13.6 10*3/uL — AB (ref 4.0–10.5)

## 2017-09-08 LAB — COMPREHENSIVE METABOLIC PANEL
ALBUMIN: 3.3 g/dL — AB (ref 3.5–5.0)
ALK PHOS: 53 U/L (ref 38–126)
ALT: 18 U/L (ref 14–54)
ANION GAP: 9 (ref 5–15)
AST: 17 U/L (ref 15–41)
BILIRUBIN TOTAL: 0.3 mg/dL (ref 0.3–1.2)
BUN: 30 mg/dL — ABNORMAL HIGH (ref 6–20)
CALCIUM: 9.1 mg/dL (ref 8.9–10.3)
CO2: 35 mmol/L — ABNORMAL HIGH (ref 22–32)
Chloride: 101 mmol/L (ref 101–111)
Creatinine, Ser: 0.89 mg/dL (ref 0.44–1.00)
GFR calc Af Amer: >60 mL/min (ref >60)
GLUCOSE: 148 mg/dL — AB (ref 65–99)
Potassium: 4 mmol/L (ref 3.5–5.1)
Sodium: 145 mmol/L (ref 135–145)
TOTAL PROTEIN: 6.8 g/dL (ref 6.5–8.1)

## 2017-09-08 LAB — HEMOGLOBIN A1C
Hgb A1c MFr Bld: 6.2 % — ABNORMAL HIGH (ref 4.8–5.6)
MEAN PLASMA GLUCOSE: 131.24 mg/dL

## 2017-09-08 LAB — PHOSPHORUS: Phosphorus: 3.6 mg/dL (ref 2.5–4.6)

## 2017-09-08 LAB — MAGNESIUM: MAGNESIUM: 2 mg/dL (ref 1.7–2.4)

## 2017-09-08 MED ORDER — FUROSEMIDE 20 MG PO TABS: 20.0000 mg | ORAL_TABLET | Freq: Two times a day (BID) | ORAL | 0 refills | Status: DC

## 2017-09-08 MED ORDER — POTASSIUM CHLORIDE ER 10 MEQ PO TBCR: 10.0000 meq | EXTENDED_RELEASE_TABLET | Freq: Two times a day (BID) | ORAL | 0 refills | Status: DC

## 2017-09-08 MED ORDER — DOXYCYCLINE HYCLATE 100 MG PO TABS: 100.0000 mg | ORAL_TABLET | Freq: Two times a day (BID) | ORAL | 0 refills | Status: AC

## 2017-09-08 MED ORDER — PREDNISONE 10 MG (21) PO TBPK: ORAL_TABLET | ORAL | 0 refills | Status: AC

## 2017-09-08 MED ORDER — IRBESARTAN 300 MG PO TABS: 300.0000 mg | ORAL_TABLET | Freq: Every day | ORAL | 0 refills | Status: AC

## 2017-09-08 MED ORDER — FUROSEMIDE 20 MG PO TABS: 20.0000 mg | ORAL_TABLET | Freq: Two times a day (BID) | ORAL | 0 refills | Status: AC | PRN

## 2017-09-08 MED ORDER — ENOXAPARIN SODIUM 80 MG/0.8ML ~~LOC~~ SOLN: 80.0000 mg | SUBCUTANEOUS | Status: DC

## 2017-09-08 MED ORDER — GUAIFENESIN ER 600 MG PO TB12: 1200.0000 mg | ORAL_TABLET | Freq: Two times a day (BID) | ORAL | 0 refills | Status: AC

## 2017-09-08 NOTE — Discharge Summary (Signed)
Physician Discharge Summary  [name] FAO:[id number] DOB: 06-21-1955 DOA: 09/06/2017  PCP: [facility]  Admit date: 09/06/2017 Discharge date: 09/08/2017  Admitted From: Home Disposition: Home  Recommendations for Outpatient Follow-up:  1. Follow up with PCP in 1-2 weeks 2. Follow up with Pulmonary in 1-2 weeks 3. Follow up an Establish with Cardiology in 1-2 weeks for Coronary Artery Calcifications, Aortic Atherosclerosis and Volume Overload; have PCP make Referral  4. Please obtain CMP/CBC, Mag, Phos in one week 5. Repeat CXR in 3-6 weeks  6. Please follow up on the following pending results:  Home Health: No Equipment/Devices: Oxygen via [place] 1 Liter    Discharge Condition: Stable CODE STATUS: FULL CODE Diet recommendation: Heart Healthy Carb Modified Diet  Brief/Interim Summary: Marie Blockeris a 62 y.o.femalewith medical history significant ofDM, HTN, HLD, depression and other comorbids who presents with 1 day history of shortness of breath, non-productive cough. She also admits to swollen tongue but now that has resolved. She denies any fevers, but has had chills. Some nausea and 1 time vomiting, but no abdominal pain or diarrhea.She denies any chest pain. She is a current everyday smoker, has cut herself down from 2 packs/day to 6 cigarettes/day. She has been smoking for over 20 years. She also admits to peripheral edema over the past week. In the ED patient was hypoxic on room air 76 to 83%. This improved with nasal cannula O2. Troponin negative, BNP 46. Chest x-ray showed "cardiomegaly with pulmonary vascular prominence and bilateral interstitial prominence suggesting CHF."  She was admitted for acute hypoxic respiratory failure likely secondary to COPD exacerbation and suspected CHF exacerbation was currently being worked up and diuresed.  She was placed on antibiotics, duo nebs, supplemental oxygen and is being diuresed. She improved with Diuresis and  ECHOCardiogram did not show any evidence of Systolic or Diastolic Heart Failure. Likely patient had volume overload causing her pulmonary vascular prominence and bilateral interstitial prominence. She ambulated the halls and desaturated to 86% and was placed on 1 Liter with improvement. She was deemed medically stable to D/C Home and will need to follow up with PCP, Pulmonary, and Follow up and establish with Cardiology in the outpatient setting for medication adjustments and for volume overload.   Discharge Diagnoses:  Principal Problem:   Acute hypoxemic respiratory failure (HCC) Active Problems:   COPD with acute exacerbation (HCC)   DM (diabetes mellitus) (HCC)   HTN (hypertension)   Depression   HLD (hyperlipidemia)   Tobacco abuse  Acute Respiratory Failure with Hypoxia in the setting of Acute COPD Exacerbation; Suspected CHF ruled out and it was likely from Volume Overload   -Patient Diffusely Wheezing on Examination but significantly improved  -CXR showed Cardiomegaly with pulmonary vascular prominence and bilateral interstitial prominence consistent with CHF. No pleural effusion or pneumothorax. -Desaturated on Room Air on admission  -Continue Supplemental oxygen via nasal cannula and wean oxygen as tolerated -Continuous pulse oximetry monitoring and maintain O2 saturation greater than 92% - Ambulatory walk screen prior to discharge showed patient desaturated to 86% and improved on 1 Liter -Repeat CXR this AM showed Findings suggesting mild vascular congestion with mild stable cardiomegaly. Prominent right paramediastinal density extending to the right hilum. Recommend chest CT for further evaluation as this may be due to parenchymal airspace process versus adenopathy. -CT Chest Ordered to evaluate prominent paramediastinal density and CT scan showed no acute cardiopulmonary disease. The prominence in question on the CXR was favored to be the prominence of azygos veins.  Patient also  had Coronary Artery Calcifications and Aortic Atherosclerosis  -Refusing CPAP as well   COPD exacerbation/ Hx of Asthma  -Still actively smokes -Continued with Solu-Medrol 60 mg IV every 6 scheduled and changed to Sterapred Taper -Continue with doxycycline 100 mg p.o. every 12h x 5 days  -Resume Home Nebs -Added flutter valve, incentive spirometer, and 1200 mg p.o. twice daily Guaifenesin and continue -Follow up with Pulmonary at D/C -Repeat CXR in 3-6 weeks  Volume Overload (Unclear Etiology), improved  -Chest x-ray revealed bilateral pulmonary vascular prominence with bilateral interstitial prominence.  -BNP only 46.0 but she is Morbidly obese. She does have trace bilateral peripheral edema which is new over the past week -Patient was given IV Lasix in the emergency department and will given another IV 40 mg of Lasix today -Checking Echocardiogram and was normal with EF of 60-65%, normal wall motion, and normal Left Ventricular Diastolic Function  -Strict I's/O's, Daily Weights, and Fluid Restrict to 1500 mL -Patient is -1.241 Liters and Weight is Inaccurate -Continue to Monitor Volume Status Carefully -C/w Irbesartan 300 mg po Daily and Nebivolol 5 mg po Daily for now and discontinue Diovan-HCTZ -Resume home Lasix PRN -Follow up and Establish with Cardiology  -Patient refusing to wear Telemetry and continues to remove  Diabetes Mellitus Type 2 -Currently Holding Metformin on admission but may resume at Discharge -C/w Moderate Novolog SSI AC while hospitalized -Checked HbA1c and was 6.2 -CBG's Ranging from 122-166  HTN -C/w Amlodipine 10 mg Daily, Irbesartan 300 mg po Daily, Nebivolol 5 mg po Daily , and will hold HCTZ 25 mg po Daily and stop -Gven IV Lasix Administration yesterday -Resume Home Lasix 20 mg po BIDprn     Depression and Anxiety  -Continue Sertraline 20 mg po Daily   HLD -Continue Simvastatin 20 mg po Daily   Tobacco Abuse -Smoking Cessation  counseling given  Polycythemia/Erythrocytosis -Hb/Hct was 16.0/52.6 in the setting of Smoking; Repeat was 15.3 -Continue to Monitor and Repeat CBC in AM   Leukocytosis -In the setting of IV Steroid Administration -WBC went from 8.9 -> 13.6 -Continue to Monitor for S/Sx of Infection -Repeat CBC as an outpatient   CAD -Noted on CT Scan -Follow up and Establish with Cardiology as an outpatient   Discharge Instructions  Allergies as of 09/08/2017   No Known Allergies     Medication List    STOP taking these medications   valsartan-hydrochlorothiazide 320-25 MG tablet Commonly known as:  DIOVAN-HCT     TAKE these medications   albuterol (2.5 MG/3ML) 0.083% nebulizer solution Commonly known as:  PROVENTIL Inhale 2.5 mg into the lungs every 6 (six) hours as needed for wheezing or shortness of breath.   amLODipine 10 MG tablet Commonly known as:  NORVASC Take 10 mg by mouth daily.   BYSTOLIC 5 MG tablet Generic drug:  nebivolol Take 5 mg by mouth daily.   doxycycline 100 MG tablet Commonly known as:  VIBRA-TABS Take 1 tablet (100 mg total) by mouth every 12 (twelve) hours for 3 days.   erythromycin ophthalmic ointment Place 1 application into both eyes 2 (two) times daily.   furosemide 20 MG tablet Commonly known as:  LASIX Take 1 tablet (20 mg total) by mouth 2 (two) times daily. What changed:    when to take this  reasons to take this   guaiFENesin 600 MG 12 hr tablet Commonly known as:  MUCINEX Take 2 tablets (1,200 mg total) by mouth 2 (two) times daily.  irbesartan 300 MG tablet Commonly known as:  AVAPRO Take 1 tablet (300 mg total) by mouth daily. Start taking on:  09/09/2017   metFORMIN 500 MG tablet Commonly known as:  GLUCOPHAGE Take 500 mg by mouth 2 (two) times daily with a meal.   oxybutynin 5 MG tablet Commonly known as:  DITROPAN Take 5 mg by mouth 2 (two) times daily.   potassium chloride 10 MEQ tablet Commonly known as:  K-DUR Take  1 tablet (10 mEq total) by mouth 2 (two) times daily.   predniSONE 10 MG (21) Tbpk tablet Commonly known as:  STERAPRED UNI-PAK 21 TAB Take 6 tablets day 1, 5 tablets day 2, 4 tablets day 3, 3 tablets day 4, 2 tablets day 5, 1 tablet day 6 and stop on day 7   sertraline 50 MG tablet Commonly known as:  ZOLOFT Take 50 mg by mouth daily.   simvastatin 20 MG tablet Commonly known as:  ZOCOR Take 20 mg by mouth daily.   TRULICITY 1.5 MG/0.5ML Sopn Generic drug:  Dulaglutide 1.5 mg once a week. Mon   Vitamin D (Ergocalciferol) 50000 units Caps capsule Commonly known as:  DRISDOL Take 50,000 Units by mouth every 7 (seven) days.            Durable Medical Equipment  (From admission, onward)        Start     Ordered   09/08/17 1558  For home use only DME oxygen  Once    Comments:  HHRT please evaluate for light weight portable (POC) simplygo  Question Answer Comment  Mode or (Route) Nasal cannula   Liters per Minute 1   Frequency Continuous (stationary and portable oxygen unit needed)   Oxygen conserving device No   Oxygen delivery system Gas      09/08/17 1557     Follow-up Information    [facility]. Call.   Why:  Follow up within 1 week  Contact information: [address] [place] [address] [phone]        Bethanie DickerBeauford, Wayne, MD Follow up on 09/10/2017.   Specialty:  Pulmonary Disease Why:  please keep appt scheduled Tuesday at 10 am Contact information: [facility] [address] [place] [address] [phone]          No Known Allergies  Consultations:  None  Procedures/Studies: Ct Chest Wo Contrast  Result Date: 09/08/2017 CLINICAL DATA:  Current smoker now with shortness of breath and nonproductive cough. Questioned prominent right paramediastinal density demonstrated on preceding chest radiograph. EXAM: CT CHEST WITHOUT CONTRAST TECHNIQUE: Multidetector CT imaging of the chest was performed following the  standard protocol without IV contrast. COMPARISON:  Chest radiograph-09/08/2017; 09/06/2017 FINDINGS: Cardiovascular: Cardiomegaly. Coronary artery calcifications. No pericardial effusion. Normal caliber the main pulmonary artery. Scattered atherosclerotic plaque within a normal caliber thoracic aorta. No definitive intra mural hematoma. Note is made of mild prominence of the azygos, likely the etiology of the apparent prominence of the right hilum questioned on preceding chest radiograph. Mediastinum/Nodes: Evaluation of the mediastinal structures is degraded secondary to the lack of intravenous contrast. Scattered mediastinal lymph nodes are not enlarged by size criteria with index pretracheal lymph node measuring 0.7 cm in greatest short axis diameter (image 41, series 2). No definitive bulky mediastinal, hilar axillary lymphadenopathy on this noncontrast examination. Lungs/Pleura: Minimal bilateral lower lobe subsegmental atelectasis. No discrete focal airspace opacities. No pleural effusion or pneumothorax. The central pulmonary airways appear widely patent. Minimal biapical centrilobular emphysematous  change. No discrete pulmonary nodules. Upper Abdomen: Limited noncontrast evaluation of the upper abdomen is normal. Musculoskeletal: No acute or aggressive osseous abnormalities. Regional soft tissues appear normal. Normal appearance of the thyroid gland. IMPRESSION: 1. No acute cardiopulmonary disease. 2. Apparent prominence of the right hilum questioned on preceding chest radiograph is favored to be secondary to prominence of the azygos vein as there is no definitive mediastinal or hilar lymphadenopathy on this noncontrast examination. 3. Coronary artery calcifications. Aortic Atherosclerosis (ICD10-I70.0). Electronically Signed   By: Simonne Come M.D.   On: 09/08/2017 15:29   Dg Chest Port 1 View  Result Date: 09/08/2017 CLINICAL DATA:  Shortness of breath. EXAM: PORTABLE CHEST 1 VIEW COMPARISON:   09/06/2017 and 09-16-16 FINDINGS: Lungs are adequately inflated with mild prominence of the perihilar markings likely mild vascular congestion as this is not significantly changed. Prominent right paramediastinal density extending to the hilum as this is slightly more dense and more medial compared to the previous exam from 09-16-2016 and more pronounced compared to the recent prior exam. No effusion. Mild stable cardiomegaly. Remainder of the exam is unchanged. IMPRESSION: Findings suggesting mild vascular congestion with mild stable cardiomegaly. Prominent right paramediastinal density extending to the right hilum. Recommend chest CT for further evaluation as this may be due to parenchymal airspace process versus adenopathy. Electronically Signed   By: Elberta Fortis M.D.   On: 09/08/2017 07:39   Dg Chest Portable 1 View  Result Date: 09/06/2017 CLINICAL DATA:  Shortness of breath. EXAM: PORTABLE CHEST 1 VIEW COMPARISON:  05/21/2010. FINDINGS: Cardiomegaly with pulmonary vascular prominence and bilateral interstitial prominence consistent with CHF. No pleural effusion or pneumothorax. IMPRESSION: Cardiomegaly with pulmonary vascular prominence and bilateral interstitial prominence suggesting CHF. Electronically Signed   By: Maisie Fus  Register   On: 09/06/2017 08:52    ECHOCARDIOGRAM ------------------------------------------------------------------- Study Conclusions  - Left ventricle: The cavity size was normal. Wall thickness was   increased in a pattern of moderate LVH. Systolic function was   normal. The estimated ejection fraction was in the range of 60%   to 65%. Wall motion was normal; there were no regional wall   motion abnormalities. Left ventricular diastolic function   parameters were normal. - Aortic valve: Trileaflet; normal thickness, mildly calcified   leaflets. Valve area (VTI): 2.22 cm^2. Valve area (Vmax): 2.12   cm^2. Valve area (Vmean): 2.1 cm^2. - Left atrium: The atrium  was moderately dilated. - Right ventricle: The cavity size was mildly dilated.  Subjective: Seen and examined at bedside states her breathing had improved significantly.  No chest pain, shortness breath, nausea, vomiting.  She did desaturate on room air to 86% while ambulating but at rest she is fine.  No other concerns or complaints at this time is ready to go home.  Discharge Exam: Vitals:   09/08/17 1139 09/08/17 1446  BP: 140/79 138/64  Pulse: (!) 56   Resp:  18  Temp:  98.6 F (37 C)  SpO2:     Vitals:   09/08/17 0535 09/08/17 0917 09/08/17 1139 09/08/17 1446  BP: 127/67  140/79 138/64  Pulse: (!) 52  (!) 56   Resp: 18   18  Temp: 98.4 F (36.9 C)   98.6 F (37 C)  TempSrc: Oral   Oral  SpO2: 95% 96%    Weight:      Height:       General: Pt is alert, awake, not in acute distress Cardiovascular: RRR, S1/S2 +, no rubs, no gallops Respiratory:  Diminished bilaterally, no wheezing, no rhonchi Abdominal: Soft, NT, Distended 2/2 body habitus, bowel sounds + Extremities: Trace -1+ edema, no cyanosis  The results of significant diagnostics from this hospitalization (including imaging, microbiology, ancillary and laboratory) are listed below for reference.    Microbiology: No results found for this or any previous visit (from the past 240 hour(s)).   Labs: BNP (last 3 results) Recent Labs    09/06/17 0838  BNP 46.0   Basic Metabolic Panel: Recent Labs  Lab 09/06/17 0838 09/07/17 0604 09/08/17 0533  NA 144 145 145  K 4.2 4.7 4.0  CL 103 102 101  CO2 35* 35* 35*  GLUCOSE 107* 144* 148*  BUN 11 15 30*  CREATININE 0.67 0.69 0.89  CALCIUM 8.4* 9.2 9.1  MG  --   --  2.0  PHOS  --   --  3.6   Liver Function Tests: Recent Labs  Lab 09/06/17 0838 09/08/17 0533  AST 19 17  ALT 14 18  ALKPHOS 60 53  BILITOT 0.4 0.3  PROT 7.1 6.8  ALBUMIN 3.5 3.3*   No results for input(s): LIPASE, AMYLASE in the last 168 hours. No results for input(s): AMMONIA in the last  168 hours. CBC: Recent Labs  Lab 09/06/17 0838 09/07/17 0604 09/08/17 0533  WBC 8.1 8.9 13.6*  NEUTROABS 6.1  --  12.5*  HGB 15.2* 16.0* 15.3*  HCT 47.0* 52.6* 49.4*  MCV 98.1 102.1* 100.4*  PLT 186 219 241   Cardiac Enzymes: Recent Labs  Lab 09/06/17 0838  TROPONINI <0.03   BNP: Invalid input(s): POCBNP CBG: Recent Labs  Lab 09/07/17 1643 09/07/17 2133 09/08/17 0801 09/08/17 1206 09/08/17 1602  GLUCAP 186* 174* 146* 166* 122*   D-Dimer No results for input(s): DDIMER in the last 72 hours. Hgb A1c Recent Labs    09/08/17 0533  HGBA1C 6.2*   Lipid Profile No results for input(s): CHOL, HDL, LDLCALC, TRIG, CHOLHDL, LDLDIRECT in the last 72 hours. Thyroid function studies No results for input(s): TSH, T4TOTAL, T3FREE, THYROIDAB in the last 72 hours.  Invalid input(s): FREET3 Anemia work up No results for input(s): VITAMINB12, FOLATE, FERRITIN, TIBC, IRON, RETICCTPCT in the last 72 hours. Urinalysis No results found for: COLORURINE, APPEARANCEUR, LABSPEC, PHURINE, GLUCOSEU, HGBUR, BILIRUBINUR, KETONESUR, PROTEINUR, UROBILINOGEN, NITRITE, LEUKOCYTESUR Sepsis Labs Invalid input(s): PROCALCITONIN,  WBC,  LACTICIDVEN Microbiology No results found for this or any previous visit (from the past 240 hour(s)).  Time coordinating discharge: 35 minutes  SIGNED:  Merlene Laughter, DO [facility] 09/08/2017, 4:05 PM Pager [phone]  If 7PM-7AM, please contact night-coverage [email or web address] Password [email or web address]

## 2017-09-08 NOTE — Progress Notes (Signed)
SATURATION QUALIFICATIONS: (This note is used to comply with regulatory documentation for home oxygen)  Patient Saturations on Room Air at Rest = 93%  Patient Saturations on Room Air while Ambulating = 86%  Patient Saturations on 1 Liters of oxygen while Ambulating = 98%  Please briefly explain why patient needs home oxygen: Stats dropping to 86 on room air while ambulating

## 2017-09-08 NOTE — Progress Notes (Signed)
Discharge instructions reviewed with patient. All questions answered. Patient wheeled to vehicle with belongings by nurse tech. 

## 2017-09-08 NOTE — Care Management Note (Signed)
Case Management Note  Patient Details  Name: [name] MRN: [id number] Date of Birth: 04/23/1955  Subjective/Objective:   COPD exac                  Action/Plan: NCM spoke to pt and states she has neb machine and CPAP at home. Explained [facility] will deliver portable tank to room and concentrator to her home. Contacted [facility] with new referral for oxygen.   Expected Discharge Date:  09/08/17               Expected Discharge Plan:  Home/Self Care  In-House Referral:  NA  Discharge planning Services  CM Consult  Post Acute Care Choice:  NA Choice offered to:  NA  DME Arranged:  Oxygen DME Agency:  [facility]  HH Arranged:  NA HH Agency:  NA  Status of Service:  Completed, signed off  If discussed at Long Length of Stay Meetings, dates discussed:    Additional Comments:  Elliot CousinShavis, Krystofer Hevener Ellen, RN 09/08/2017, 5:05 PM

## 2020-03-07 IMAGING — CT CT CHEST W/O CM
2 of 3 series · 15 of 36 positions shown, 18 images · non-contrast
Comparison: Chest radiograph-09/08/2017; 09/06/2017

CLINICAL DATA: Current smoker now with shortness of breath and
nonproductive cough. Questioned prominent right paramediastinal
density demonstrated on preceding chest radiograph.

EXAM:
CT CHEST WITHOUT CONTRAST
TECHNIQUE: Multidetector CT imaging of the chest was performed following the
standard protocol without IV contrast.

[Series 2: thorax · axial · 0.61mm/px · z∈[+1470,+1712]mm · 12 of 143 slices shown, 15 images]
[im 11/143  mediastinal]
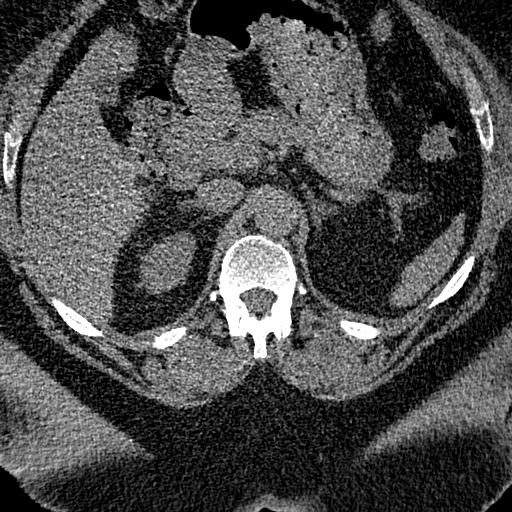
[im 11/143  lung]
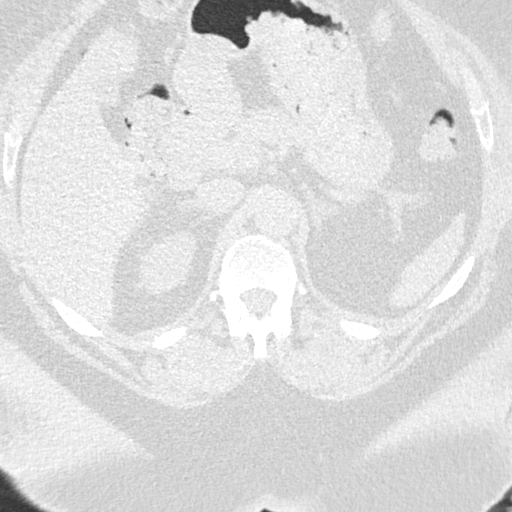
[im 22/143  lung]
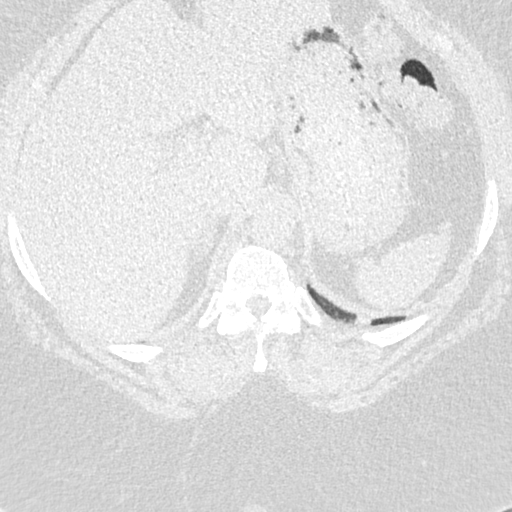
[im 32/143  lung]
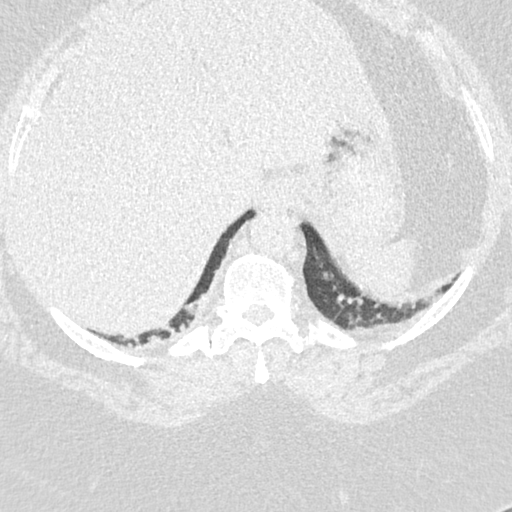
[im 43/143  lung]
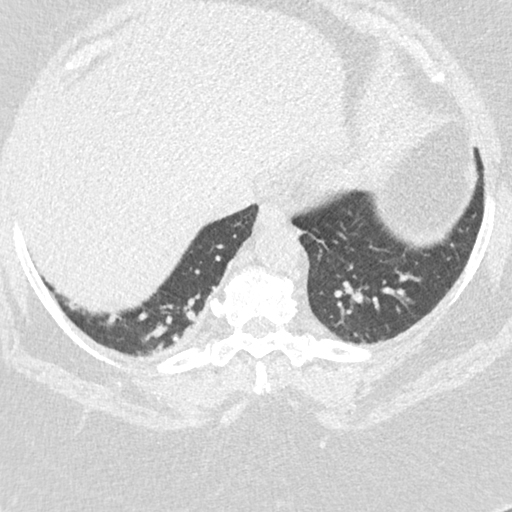
[im 53/143  mediastinal]
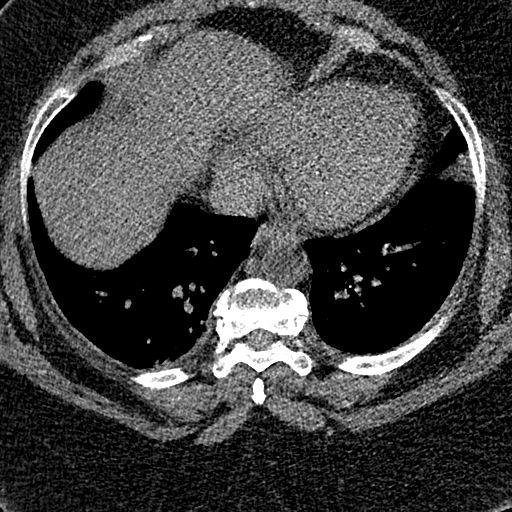
[im 53/143  lung]
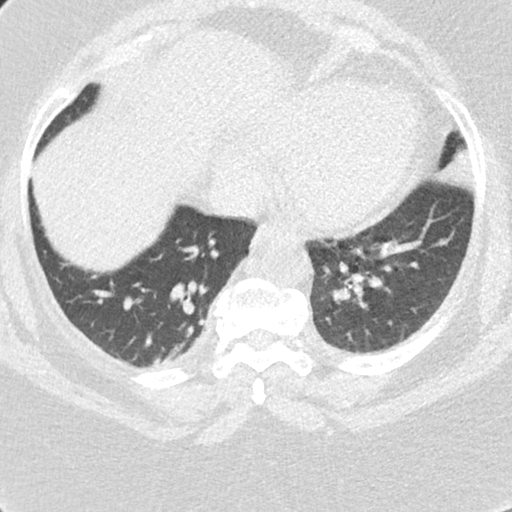
[im 64/143  lung]
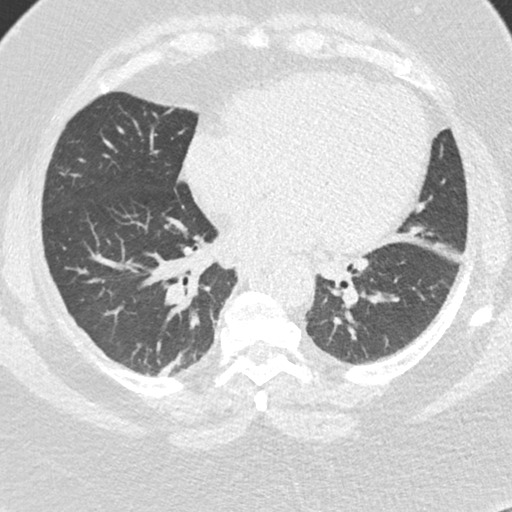
[im 79/143  lung]
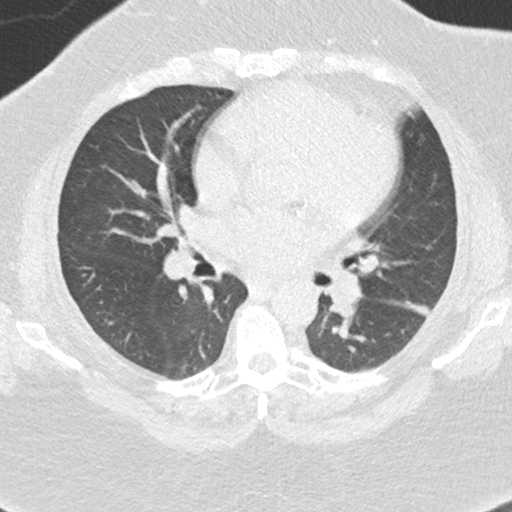
[im 90/143  lung]
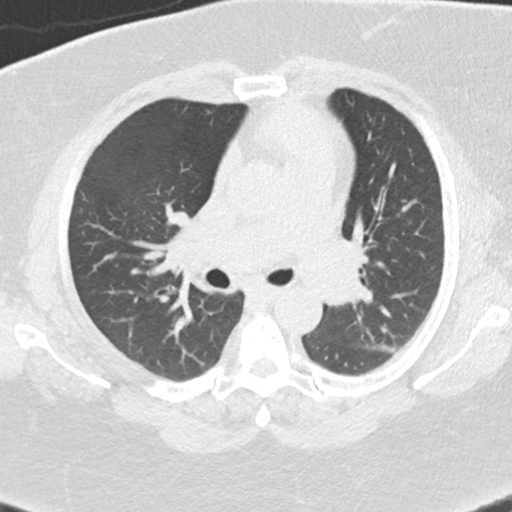
[im 100/143  mediastinal]
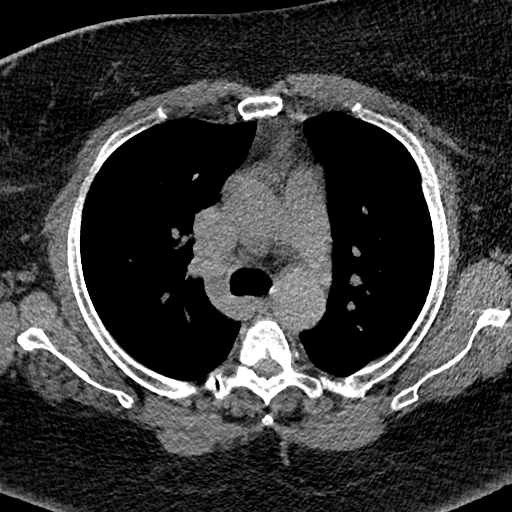
[im 100/143  lung]
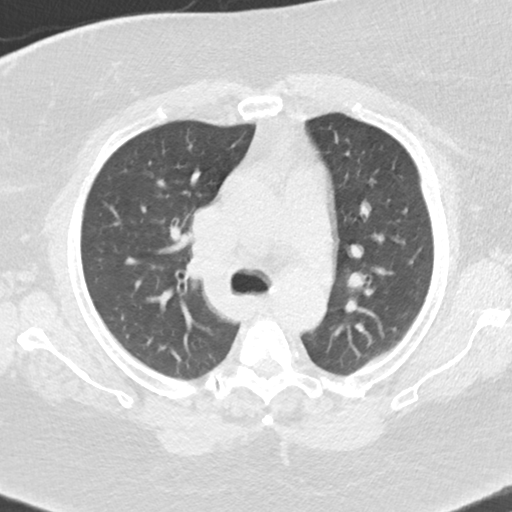
[im 111/143  lung]
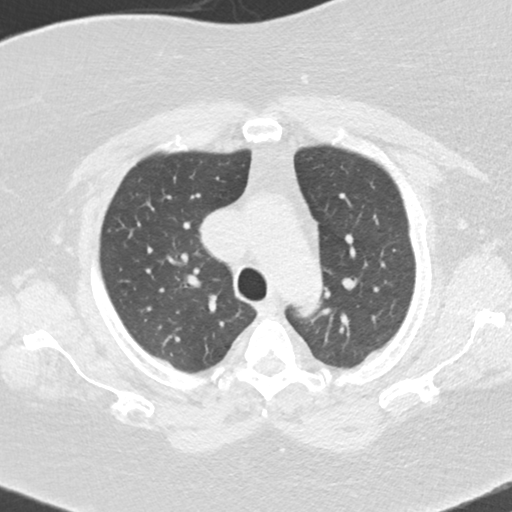
[im 121/143  lung]
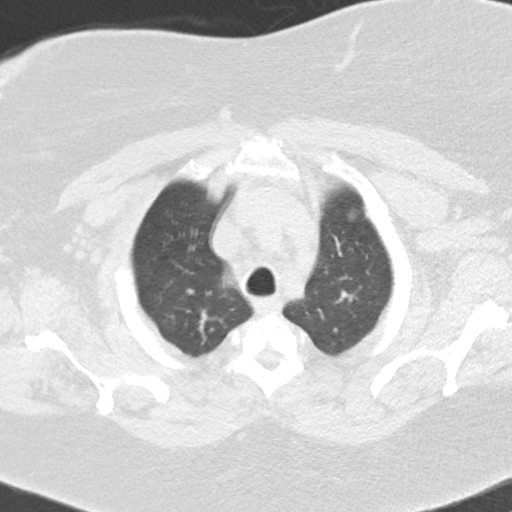
[im 132/143  lung]
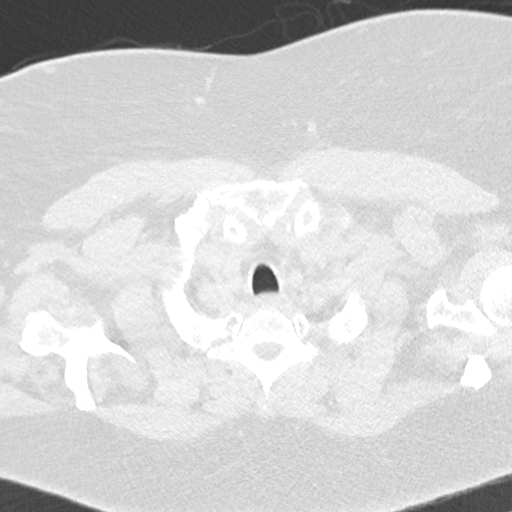

[Series 6: coronal · coronal · 0.55mm/px · 3 of 151 slices shown]
[im 31/151  lung]
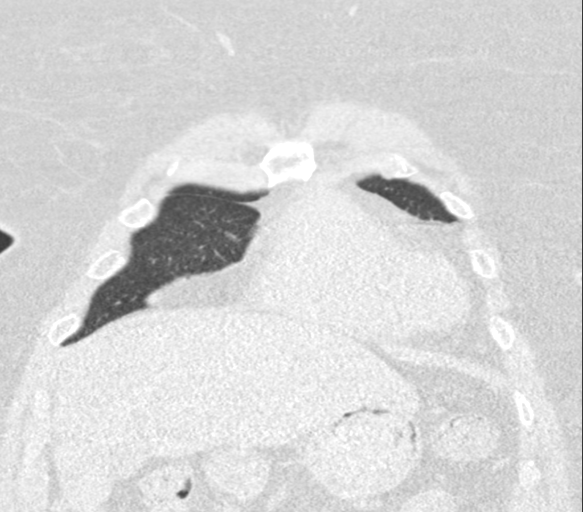
[im 61/151  lung]
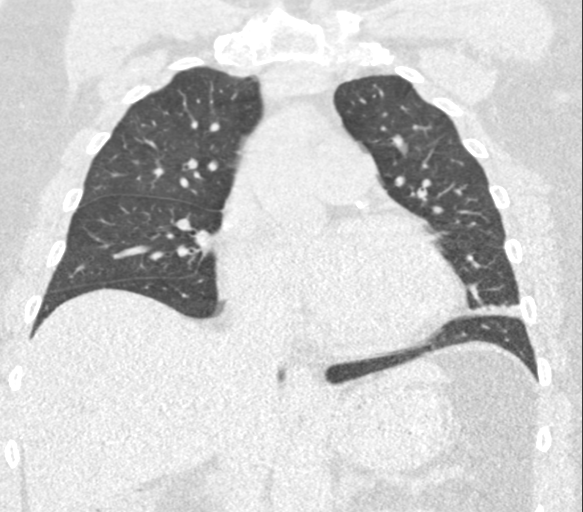
[im 91/151  lung]
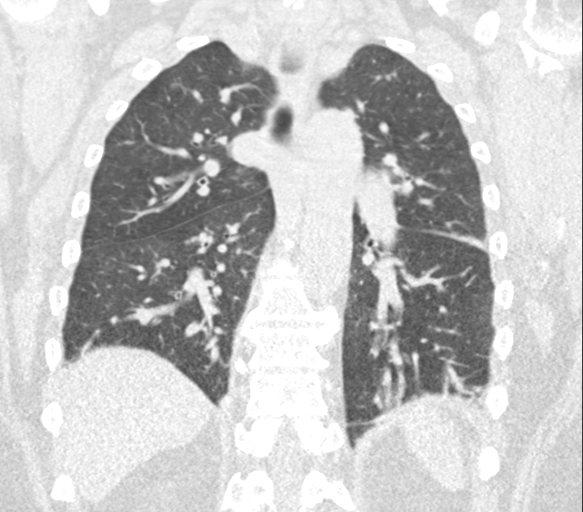

[15 of 36 positions shown; findings below may reference images not displayed]

FINDINGS: Cardiovascular: Cardiomegaly. Coronary artery calcifications. No
pericardial effusion. Normal caliber the main pulmonary artery.
Scattered atherosclerotic plaque within a normal caliber thoracic
aorta. No definitive intra mural hematoma.

Note is made of mild prominence of the azygos, likely the etiology
of the apparent prominence of the right hilum questioned on
preceding chest radiograph.

Mediastinum/Nodes: Evaluation of the mediastinal structures is
degraded secondary to the lack of intravenous contrast. Scattered
mediastinal lymph nodes are not enlarged by size criteria with index
pretracheal lymph node measuring 0.7 cm in greatest short axis
diameter (image 41, series 2). No definitive bulky mediastinal,
hilar axillary lymphadenopathy on this noncontrast examination.

Lungs/Pleura: Minimal bilateral lower lobe subsegmental atelectasis.
No discrete focal airspace opacities. No pleural effusion or
pneumothorax. The central pulmonary airways appear widely patent.
Minimal biapical centrilobular emphysematous change. No discrete
pulmonary nodules.

Upper Abdomen: Limited noncontrast evaluation of the upper abdomen
is normal.

Musculoskeletal: No acute or aggressive osseous abnormalities.
Regional soft tissues appear normal. Normal appearance of the
thyroid gland.
IMPRESSION: 1. No acute cardiopulmonary disease.
2. Apparent prominence of the right hilum questioned on preceding
chest radiograph is favored to be secondary to prominence of the
azygos vein as there is no definitive mediastinal or hilar
lymphadenopathy on this noncontrast examination.
3. Coronary artery calcifications. Aortic Atherosclerosis
([id number]).
